# Patient Record
Sex: Male | Born: 1990 | Race: White | Hispanic: No | Marital: Single | Smoking: Never smoker
Health system: Southern US, Community
[De-identification: ages and names within clinical notes are randomized; demographics above are authoritative.]

---

## 2014-11-13 ENCOUNTER — Other Ambulatory Visit: Payer: Self-pay | Admitting: Neurology

## 2014-11-13 DIAGNOSIS — R1310 Dysphagia, unspecified: Secondary | ICD-10-CM

## 2014-11-22 ENCOUNTER — Ambulatory Visit
Admission: RE | Admit: 2014-11-22 | Discharge: 2014-11-22 | Disposition: A | Discharge status: Home / Self Care | Payer: Medicaid Other | Source: Ambulatory Visit | Attending: Neurology | Admitting: Neurology

## 2014-11-22 DIAGNOSIS — R1310 Dysphagia, unspecified: Secondary | ICD-10-CM | POA: Diagnosis not present

## 2014-11-22 NOTE — Therapy (Addendum)
[facility] DIAGNOSTIC RADIOLOGY [address] [place], [place], [address] Phone: [phone]   Fax:     Modified Barium Swallow  Patient Details  Name: Jeremiah Silva MRN: [id number] Date of Birth: January 07, 1991 Referring Provider:  Morene Crocker, MD  Encounter Date: 11/22/2014   Subjective: Patient behavior: (alertness, ability to follow instructions, etc.): pt is Deaf requiring a Sign Language Interpreter at this evaluation. He is dx'd w/ CP, developmental learning disability, and decreased attention w/ tasks. Father reported pt has been experiencing chest congestion (no dx of Pneumonia), runny nose, and "wet breathing" after meals. Father stated he suspected "some of this could be due to his swallowing". He indicated pt is impulsive when eating putting large boluses of food in his mouth at a time and eating quickly. Pt does not follow instruction consistently and requires repetition and cues. Pt was not able to perform a volitional cough w/ cues/instruction from Interpreter/Father and model; Father stated pt could not blow his nose either on command. Pt drinks from a straw at home d/t moderate+ anterior spillage. Pt w/ native dentition and able to assist feeding himself though w/ decreased coordination.  Chief complaint: Dysphagia   Objective:  Radiological Procedure: A videoflouroscopic evaluation of oral-preparatory, reflex initiation, and pharyngeal phases of the swallow was performed; as well as a screening of the upper esophageal phase.  I. POSTURE: upright II. VIEW: lateral III. COMPENSATORY STRATEGIES: attempted to use multiple, dry swallows, however, pt was unable to follow instructions; time was given b/t trials; alternated food/liquid boluses  IV. BOLUSES ADMINISTERED:  Thin Liquid: multiple sips via straw  Nectar-thick Liquid: multiple sips via straw  Honey-thick Liquid: NT  Puree: 3 trials  Mechanical Soft: 1 trials V. RESULTS OF  EVALUATION: A. ORAL PREPARATORY PHASE: (The lips, tongue, and velum are observed for strength and coordination)       **Overall Severity Rating: MODERATE+. Lingual discoordination for fluid and complete A-P transfer of boluses; weakness; diffuse oral residue b/t trials. Premature spillage w/ liquids. Pt exhibited poor coordination and strength of complete labial closure around the straw and discoordination of suck and swallow.    B. SWALLOW INITIATION/REFLEX: (The reflex is normal if "triggered" by the time the bolus reached the base of the tongue)  **Overall Severity Rating: MODERATE. Delayed pharyngeal swallow initiation noted w/ all trial consistencies - boluses triggered b/t the valleculae and at times spilling to the pyriform sinuses.   C. PHARYNGEAL PHASE: (Pharyngeal function is normal if the bolus shows rapid, smooth, and continuous transit through the pharynx and there is no pharyngeal residue after the swallow)  **Overall Severity Rating: MODERATE-SEVERE. Bolus residue remained throughout the pharynx, moreso in the pyriform sinuses. Without an effortful swallow, residue did not easily pass through the UES - suspect decreased coordination w/ timing of UES relaxation for bolus to move into the Cervical Esophagus. Noted possible soft tissue in the upper Cervical Esophagus just below the UES upon viewing the swallow in slow motion. Pt was not able to use strategies of multiple swallow but time was given for pt to nonvolitionally swallow b/t trials to aid clearing of this residue as well as alternating food/liquid boluses; bolus residue never completely reduced/cleared.    D. LARYNGEAL PENETRATION: (Material entering into the laryngeal inlet/vestibule but not aspirated): b/t trials of food/liquid, trace-min. bolus residue was noted in the laryngeal vestibule from the pharyngeal residue; laryngeal penetration of thin liquids was noted x1 episode during multiple sipping trials. This was SILENT laryngeal  penetration. Pt was unable to follow through w/ a throat clear/cough strategy in attempt to protect his airway.   E. ASPIRATION: b/t trials of food/liquid, trace-min. bolus residue was noted in the upper trachea then disappeared below the shadow of the shoulders in lateral view. This was SILENT aspiration. Pt was unable to follow through w/ a cough strategy in attempts to protect his airway.  F. ESOPHAGEAL PHASE: (Screening of the upper esophagus): inconsistent, trace-min. Retention of bolus residue in the upper Esophagus below the UES which appeared to clear w/ time.   ASSESSMENT: Pt presents w/ Moderate+ oropharyngeal phase dysphagia w/ Silent laryngeal penetration and aspiration occuring. Bolus residue remained throughout the pharynx, moreso in the pyriform sinuses. Residue did not easily pass through the UES - suspect decreased coordination w/ timing of UES relaxation for bolus material to move into the Cervical Esophagus. Noted possible soft tissue in the upper Cervical Esophagus just below the UES upon viewing the swallow in slow motion as well. Pt was not able to use strategies of multiple swallow but time was given for pt to nonvolitionally swallow b/t trials to aid clearing of this residue as well as alternating food/liquid boluses; bolus residue never completely reduced/cleared. When laryngeal penetration and aspiration occurred, pt was unable to follow instruction to use a cough/throat clear strategy to aid airway protection and clearing of upper airway(Father attempted to assist, but pt was unable to follow directions). Oral phase of swallowing was c/b decreased coordination and strength; decreased labial closure.  Pt is at increased risk for aspiration of the pharyngeal residue collecting in the pharynx. Rec. f/u w/ ENT, possibly GI, to assess UES function and upper Esophageal motility. Rec. Monitoring of pt's eating behaviors to reduce risk for aspiration sec. to pt's impulsivity when  eating/drinking.   PLAN/RECOMMENDATIONS:  A. Diet: mech soft, thin liquids  B. Swallowing Precautions: aspiration precautions - monitor for impulsivity during eating/drinking and limit as best able to  C. Recommended consultation to: ENT consult; GI consult  D. Therapy recommendations: discussed use of aspiration precautions during meals.   E. Results and recommendations were discussed w/ Father      End of Session - 25-Nov-2014 1553    Visit Number 1   Number of Visits 1   Date for SLP Re-Evaluation November 25, 2014   SLP Start Time 1300   SLP Stop Time  1400   SLP Time Calculation (min) 60 min   Activity Tolerance Patient tolerated treatment well      No past medical history on file.  No past surgical history on file.  There were no vitals filed for this visit.  Visit Diagnosis: Dysphagia - Plan: DG SWALLOWING FUNC-SPEECH PATHOLOGY, DG SWALLOWING FUNC-SPEECH PATHOLOGY                               G-Codes - 11-25-14 1554    Functional Assessment Tool Used clinical judgement; MBSS   Functional Limitations Swallowing   Swallow Current Status (Z6109) At least 60 percent but less than 80 percent impaired, limited or restricted   Swallow Goal Status (U0454) At least 60 percent but less than 80 percent impaired, limited or restricted   Swallow Discharge Status [phone]) At least 60 percent but less than 80 percent impaired, limited or restricted          Problem List There are no active problems to display for this patient.  Jerilynn Som, MS, CCC-SLP  Veena Sturgess 11-25-14, 4:00  PM  [facility] DIAGNOSTIC RADIOLOGY [address] [place], [place], [address] Phone: [phone]   Fax:

## 2014-11-29 ENCOUNTER — Encounter: Payer: Self-pay | Admitting: Occupational Therapy

## 2014-11-29 ENCOUNTER — Ambulatory Visit: Payer: Medicaid Other | Attending: Neurology | Admitting: Occupational Therapy

## 2014-11-29 DIAGNOSIS — R279 Unspecified lack of coordination: Secondary | ICD-10-CM | POA: Insufficient documentation

## 2014-11-29 NOTE — Therapy (Signed)
[place] [facility] MAIN [facility] SERVICES [address] [place], [place], [address] Phone: [phone]   Fax:  [phone]  Occupational Therapy Evaluation  Patient Details  Name: Jeremiah Silva MRN: [id number] Date of Birth: Jan 05, 1991 Referring Provider:  Morene Crocker, MD  Encounter Date: 11/29/2014      OT End of Session - 11/29/14 1419    Visit Number 1   Number of Visits 1   Date for OT Re-Evaluation --  NA   OT Start Time 1301   OT Stop Time 1358   OT Time Calculation (min) 57 min   Activity Tolerance Patient tolerated treatment well   Behavior During Therapy [facility] for tasks assessed/performed      History reviewed. No pertinent past medical history.  History reviewed. No pertinent past surgical history.  There were no vitals filed for this visit.  Visit Diagnosis:  Lack of coordination - Plan: Ot plan of care cert/re-cert      Subjective Assessment - 11/29/14 1415    Subjective  Largest complaint is mobility of right hand.   Pertinent History 24 year old with CP with decreasing function of right hand.    24 year old male with CP who came to [facility] with decreasing right hand function interfering with some activities of daily living like dressing and bathing (such as not being able to reach left arm pit with right hand, opening bottles and jars, dispensing soap and shampoo. Issued dycem and demonstrated how to use to open jars and bottles, showed buttoner, and showed long sponge. Patient is able to reach left arm pit with long sponge. Gave information on how to obtain assistive devices and research more.                         OT Education - 11/29/14 1416    Education provided Yes   Education Details Educated patient and father regarding adapted equipment for dressing and bathing and opening jars and bottles. Issued dycem to help with the jars and bottles. Gave patient's father medical supply  web sight and phone number to [employer] for further adapted devices.    Person(s) Educated Patient;Parent(s)   Methods Explanation;Demonstration   Comprehension Verbalized understanding             OT Long Term Goals - 11/29/14 1422    OT LONG TERM GOAL #1   Title Patient and dad will understand use of adapted devices to aid in BADL.   Time 1   Period Days   Status New               Plan - 11/29/14 1420    OT Frequency 1x / week   OT Duration --  1 week   OT Treatment/Interventions Self-care/ADL training   Plan OT education in adapted devices   Consulted and Agree with Plan of Care Patient;Family member/caregiver        Problem List There are no active problems to display for this patient.   Gwyndolyn Kaufman, MS/OTR/L  11/29/2014, 2:31 PM  [facility] [facility] MAIN [facility] SERVICES [address] [place], [place], [address] Phone: [phone]   Fax:  [phone]

## 2017-07-12 IMAGING — RF DG SWALLOWING FUNCTION - [facility]PT [facility]
13 of 18 series · 13 of 24 positions shown · non-contrast
Comparison: none

[Series 1: run · 1 of 20 frames shown (1 of 13)]
[frame 4/20]
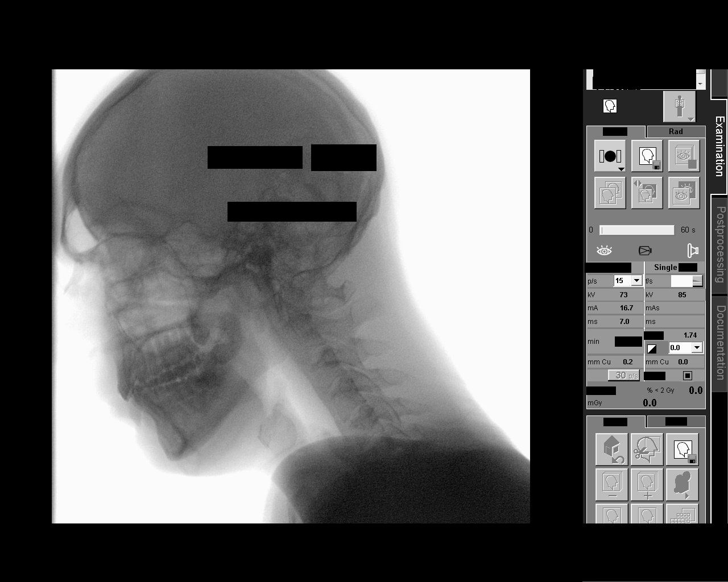

[Series 2: run · 1 of 27 frames shown (2 of 13)]
[frame 23/27]
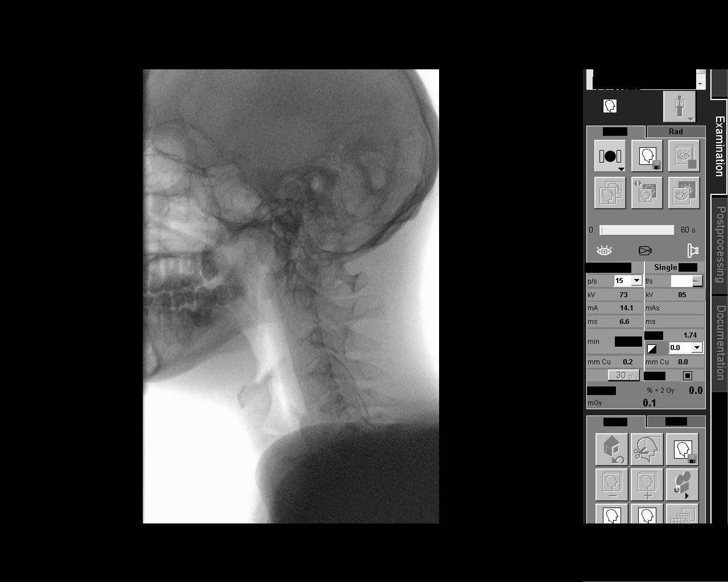

[Series 4: run · 1 of 575 frames shown (3 of 13)]
[frame 87/575]
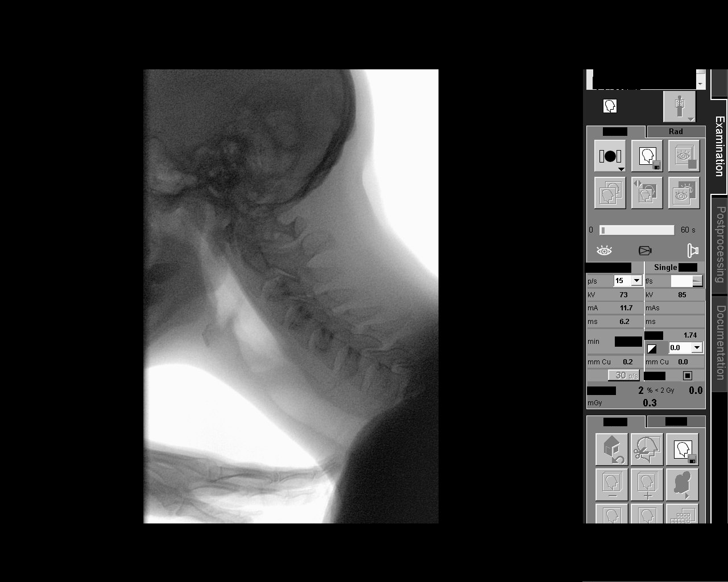

[Series 5: run · 1 of 324 frames shown (4 of 13)]
[frame 321/324]
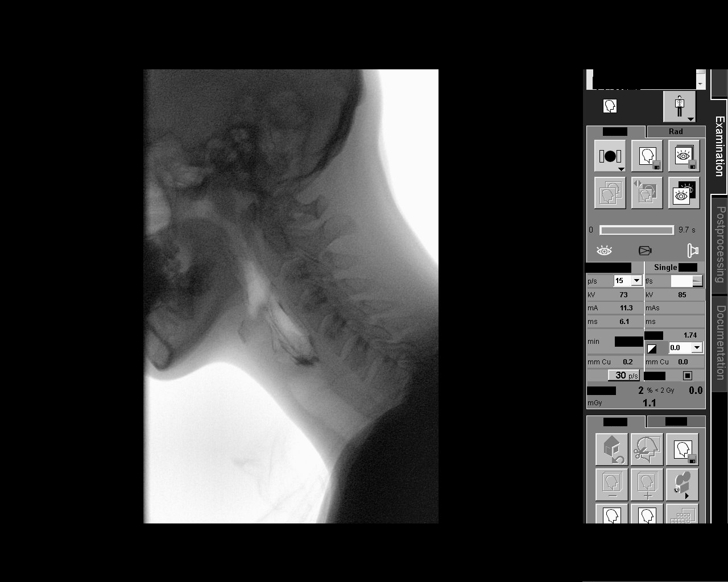

[Series 8: run · 1 of 886 frames shown (5 of 13)]
[frame 275/886]
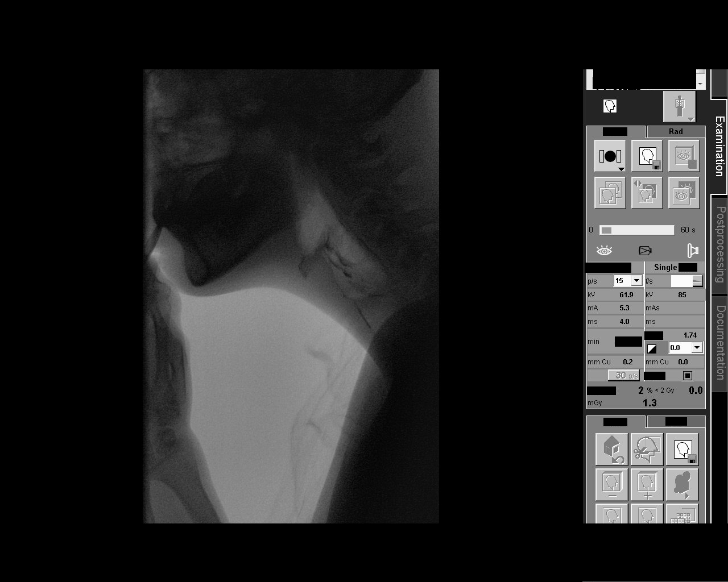

[Series 9: run · 1 of 160 frames shown (6 of 13)]
[frame 137/160]
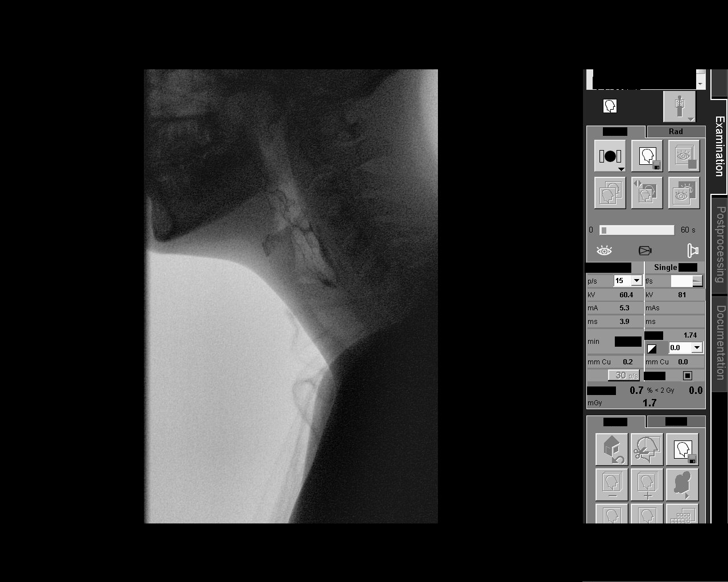

[Series 11: run · 1 of 96 frames shown (7 of 13)]
[frame 49/96]
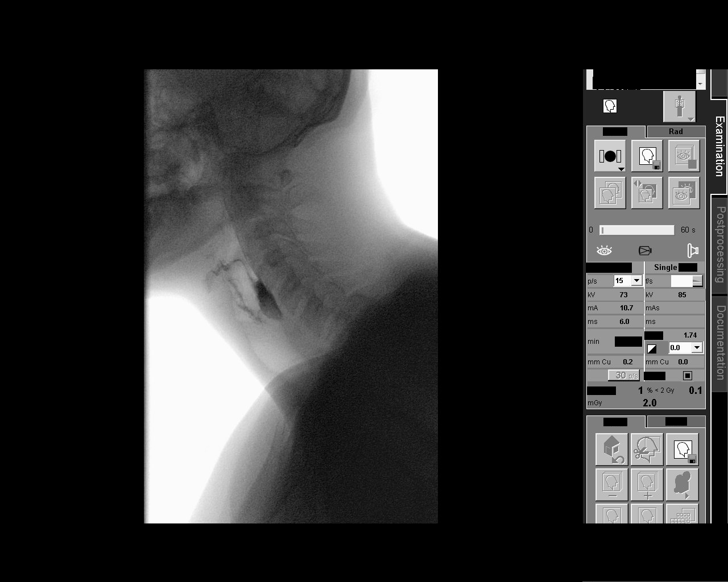

[Series 12: run · 1 of 130 frames shown (8 of 13)]
[frame 20/130]
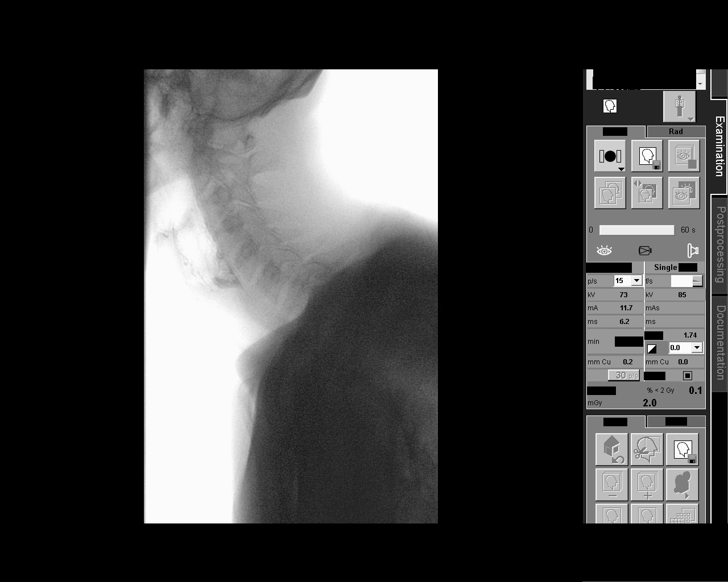

[Series 13: run · 1 of 601 frames shown (9 of 13)]
[frame 301/601]
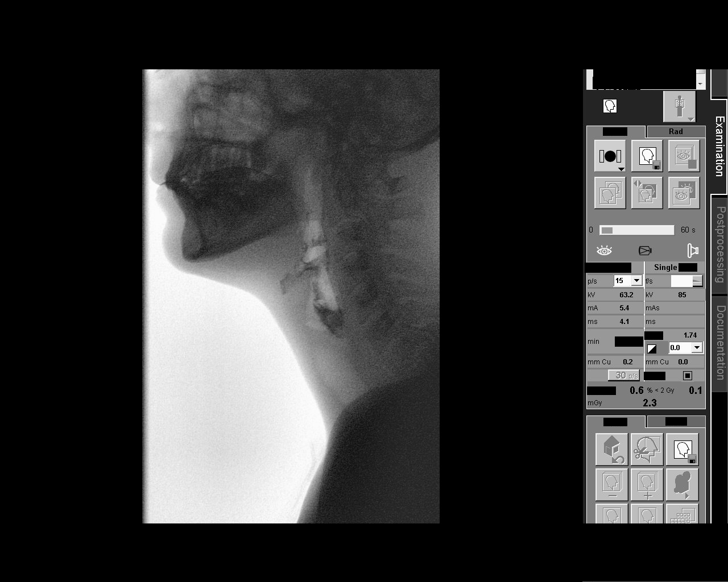

[Series 15: run · 1 of 85 frames shown (10 of 13)]
[frame 13/85]
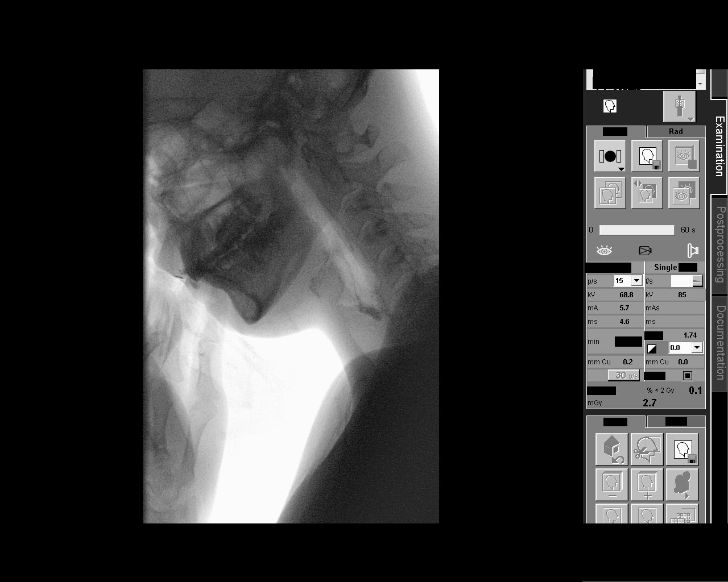

[Series 16: run · 1 of 95 frames shown (11 of 13)]
[frame 81/95]
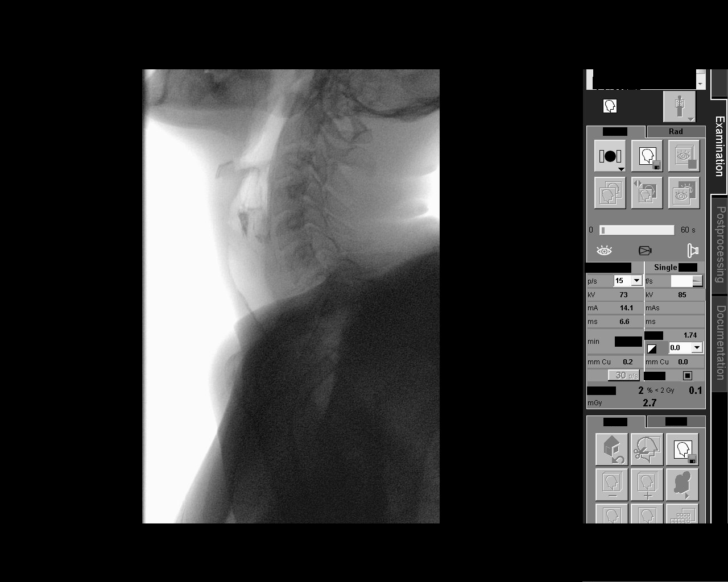

[Series 18: run · 1 of 170 frames shown (12 of 13)]
[frame 85/170]
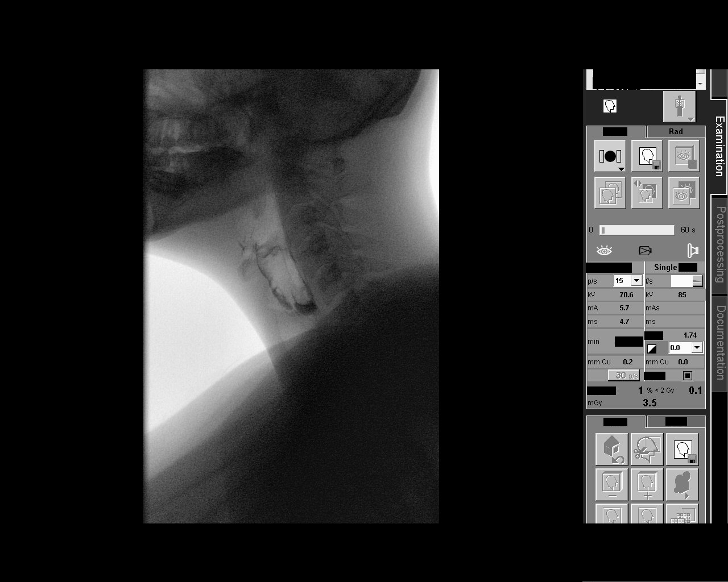

[Series 19: run · 1 of 859 frames shown (13 of 13)]
[frame 731/859]
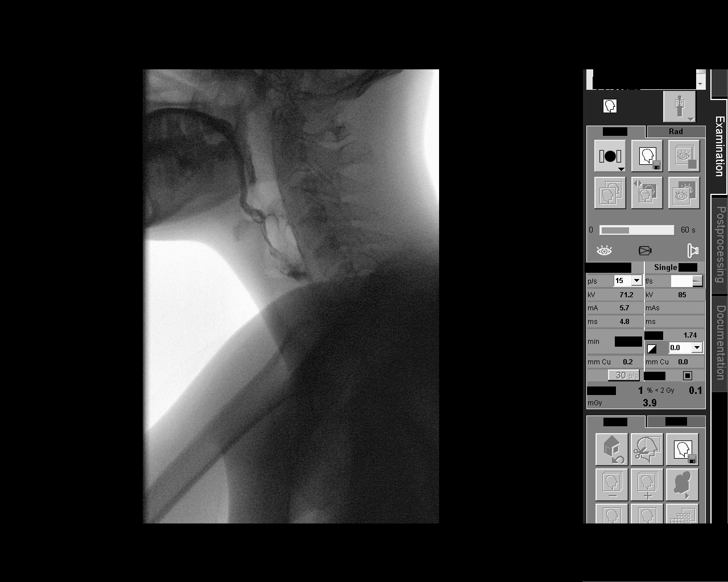

[13 of 24 positions shown; findings below may reference images not displayed]

FLUOROSCOPY FOR SWALLOWING FUNCTION STUDY:
Fluoroscopy was provided for swallowing function study, which was administered by a speech pathologist.  Final results and recommendations from this study are contained within the speech pathology report.

## 2017-09-13 ENCOUNTER — Other Ambulatory Visit: Payer: Self-pay | Admitting: Internal Medicine

## 2017-09-13 DIAGNOSIS — R1312 Dysphagia, oropharyngeal phase: Secondary | ICD-10-CM

## 2017-10-11 ENCOUNTER — Ambulatory Visit: Admission: RE | Admit: 2017-10-11 | Payer: Medicaid Other | Source: Ambulatory Visit

## 2018-03-18 ENCOUNTER — Other Ambulatory Visit: Payer: Self-pay | Admitting: Family Medicine

## 2018-03-18 DIAGNOSIS — R748 Abnormal levels of other serum enzymes: Secondary | ICD-10-CM

## 2018-03-24 ENCOUNTER — Ambulatory Visit
Admission: RE | Admit: 2018-03-24 | Discharge: 2018-03-24 | Disposition: A | Discharge status: Home / Self Care | Payer: Medicaid Other | Source: Ambulatory Visit | Attending: Family Medicine | Admitting: Family Medicine

## 2018-03-24 DIAGNOSIS — R1312 Dysphagia, oropharyngeal phase: Secondary | ICD-10-CM | POA: Diagnosis present

## 2018-03-24 DIAGNOSIS — R748 Abnormal levels of other serum enzymes: Secondary | ICD-10-CM | POA: Diagnosis present

## 2018-04-28 ENCOUNTER — Ambulatory Visit: Payer: Medicaid Other | Attending: Family Medicine | Admitting: Speech Pathology

## 2018-04-28 DIAGNOSIS — R1312 Dysphagia, oropharyngeal phase: Secondary | ICD-10-CM

## 2018-04-29 ENCOUNTER — Encounter: Payer: Self-pay | Admitting: Speech Pathology

## 2018-04-29 ENCOUNTER — Other Ambulatory Visit: Payer: Self-pay

## 2018-04-29 NOTE — Therapy (Signed)
[facility] [address] [place], [place], [address] Phone: [phone]   Fax:  [phone]  Speech Language Pathology Evaluation  Patient Details  Name: Jeremiah Silva MRN: [id number] Date of Birth: 09-Sep-1990 Referring Provider (SLP): Tanna FurryZHOU-TALBERT, SERENA S   Encounter Date: 04/28/2018  End of Session - 04/29/18 1620    Visit Number  1    Number of Visits  1    Date for SLP Re-Evaluation  04/28/18       History reviewed. No pertinent past medical history.  History reviewed. No pertinent surgical history.  There were no vitals filed for this visit.      SLP Evaluation [facility] - 04/29/18 0001      SLP Visit Information   SLP Received On  04/28/18    Referring Provider (SLP)  Sullivan LoneZHOU-TALBERT, SERENA S    Onset Date  03/16/2018    Medical Diagnosis  Cerebral palsy      Subjective   Subjective  Patient is non-verbal and has intellectual disability    Patient/Family Stated Goal  Safe swallowing      General Information   HPI  pt is Deaf requiring a Sign Language Interpreter at this evaluation. He is dx'd w/ CP, developmental learning disability, and decreased attention w/ tasks. Father reported pt has been experiencing chest congestion (no dx of Pneumonia), runny nose, and "wet breathing" after meals. Father stated he suspected "some of this could be due to his swallowing". He indicated pt is impulsive when eating putting large boluses of food in his mouth at a time and eating quickly. Pt does not follow instruction consistently and requires repetition and cues. Pt was not able to perform a volitional cough w/ cues/instruction from Interpreter/Father and model; Father stated pt could not blow his nose either on command. Pt drinks from a straw at home d/t moderate+ anterior spillage. Pt w/ native dentition and able to assist feeding himself though w/ decreased coordination.       Prior Functional Status   Cognitive/Linguistic  Baseline  Baseline deficits    Baseline deficit details  developmental learning disability     Lives With  Family   Father     Cognition   Overall Cognitive Status  History of cognitive impairments - at baseline      Oral Motor/Sensory Function   Overall Oral Motor/Sensory Function  Impaired at baseline    Labial ROM  Reduced left    Labial Strength  Reduced    Labial Sensation  Reduced    Labial Coordination  Reduced    Lingual ROM  Reduced right;Reduced left    Lingual Strength  Reduced    Lingual Sensation  Reduced    Lingual Coordination  Reduced      Motor Speech   Overall Motor Speech  Impaired at baseline   Deaf                     SLP Education - 04/29/18 1619    Education Details  aspiration precautions, diet modifications, stay active    Person(s) Educated  Parent(s)    Methods  Explanation;Handout    Comprehension  Verbalized understanding           Plan - 04/29/18 1620    Clinical Impression Statement  Jeremiah CrazierDylan Silva is a 28 year old with cerebral palsy, deafness, developmental learning disability, and poor attention to task.  The patient had an MBS 11/22/2014 showing silent laryngeal penetration and aspiration, pharyngeal  residue post swallow, reduced lingual coordination and strength, and reduced lip closure.  Overall the patient has maintained his pulmonary health over the past 3  years despite probable ongoing aspiration.  The patient's father is very knowledgeable and realistic regarding his son's swallowing status.  During this session we reviewed the potential dangers of aspiration and what to look for regarding pneumonia.  We reviewed general aspiration precautions with emphasis on oral care, staying active to help keep lungs clear, and strategies/modifications to prevent choking on foods when the patient is not supervised.  Direct speech therapy is not likely to improve swallow function as the patient does not have the cognitive skills to learn  / implement safe swallowing techniques independently or perform swallowing exercises accurately. However, the patient would benefit from referral to physical therapy to develop an appropriate home exercise program to encourage more physical activity.  The patient's father reports that they are working with [employer] to get supported employment.  I recommended that if this does not work out as planned, he might consider an adult day care program so that the patient can have the supervision he needs for safer eating.     Speech Therapy Frequency  One time visit    Treatment/Interventions  Aspiration precaution training;Patient/family education    Consulted and Agree with Plan of Care  Family member/caregiver    Family Member Consulted  Father       Patient will benefit from skilled therapeutic intervention in order to improve the following deficits and impairments:   Dysphagia, oropharyngeal phase - Plan: SLP plan of care cert/re-cert    Problem List There are no active problems to display for this patient.  Dollene PrimroseSusan G Asusena Sigley, MS/CCC- SLP  Leandrew KoyanagiAbernathy, Susie 04/29/2018, 4:26 PM  [facility] [address] [place], [place], [address] Phone: [phone]   Fax:  [phone]  Name: Jeremiah CrazierDylan Silva MRN: [id number] Date of Birth: 07-28-1990

## 2018-05-02 ENCOUNTER — Ambulatory Visit: Payer: Medicaid Other | Admitting: Speech Pathology

## 2018-05-04 ENCOUNTER — Ambulatory Visit: Payer: Medicaid Other | Admitting: Speech Pathology

## 2018-05-09 ENCOUNTER — Ambulatory Visit: Payer: Medicaid Other | Admitting: Speech Pathology

## 2018-05-11 ENCOUNTER — Ambulatory Visit: Payer: Medicaid Other | Admitting: Speech Pathology

## 2018-05-16 ENCOUNTER — Ambulatory Visit: Payer: Medicaid Other | Admitting: Speech Pathology

## 2018-05-18 ENCOUNTER — Ambulatory Visit: Payer: Medicaid Other | Admitting: Speech Pathology

## 2018-05-19 ENCOUNTER — Ambulatory Visit: Payer: Medicaid Other | Admitting: Speech Pathology

## 2018-05-23 ENCOUNTER — Ambulatory Visit: Payer: Medicaid Other | Admitting: Speech Pathology

## 2018-05-25 ENCOUNTER — Ambulatory Visit: Payer: Medicaid Other | Admitting: Speech Pathology

## 2018-05-30 ENCOUNTER — Ambulatory Visit: Payer: Medicaid Other | Admitting: Speech Pathology

## 2018-06-01 ENCOUNTER — Ambulatory Visit: Payer: Medicaid Other | Admitting: Speech Pathology

## 2018-06-06 ENCOUNTER — Ambulatory Visit: Payer: Medicaid Other | Admitting: Speech Pathology

## 2018-06-08 ENCOUNTER — Ambulatory Visit: Payer: Medicaid Other | Admitting: Speech Pathology

## 2018-06-13 ENCOUNTER — Ambulatory Visit: Payer: Medicaid Other | Admitting: Speech Pathology

## 2020-11-11 IMAGING — US US ABDOMEN LIMITED
2 series · 14 of 25 positions shown · non-contrast
Comparison: None.

CLINICAL DATA: 27-year-old male with abnormal alkaline phosphatase
level.

EXAM:
ULTRASOUND ABDOMEN LIMITED RIGHT UPPER QUADRANT

[Series 1: us abdomen limited · 12 of 37 slices shown (1 of 2)]
[im 1/37]
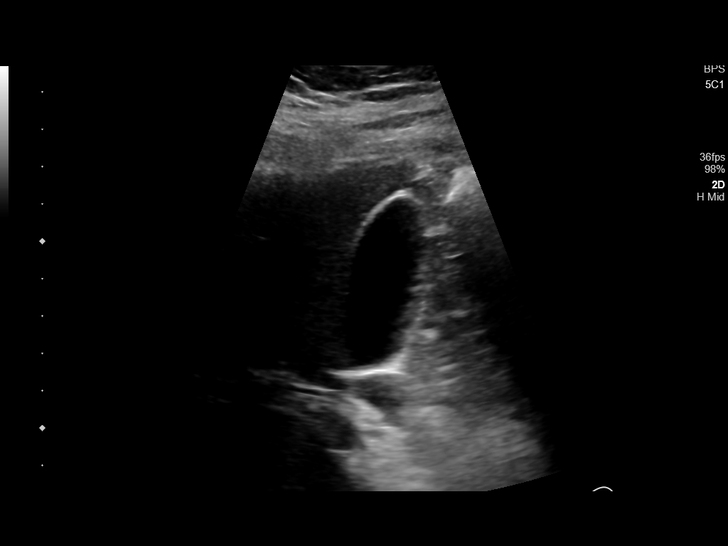
[im 4/37]
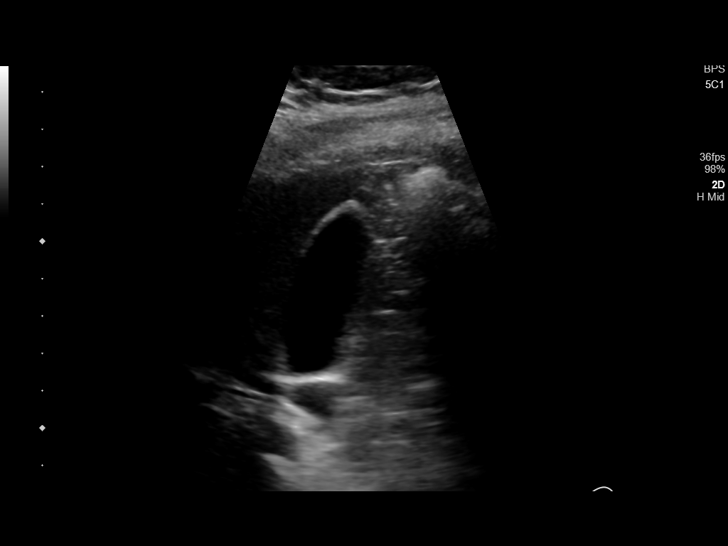
[im 7/37]
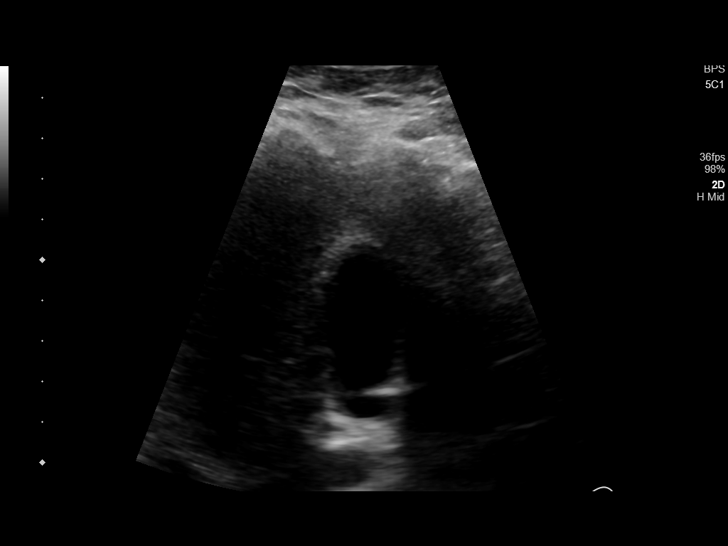
[im 11/37]
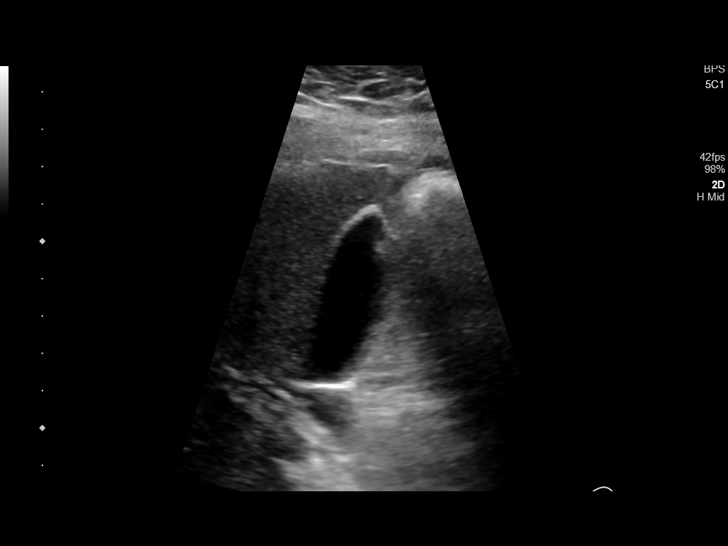
[im 14/37]
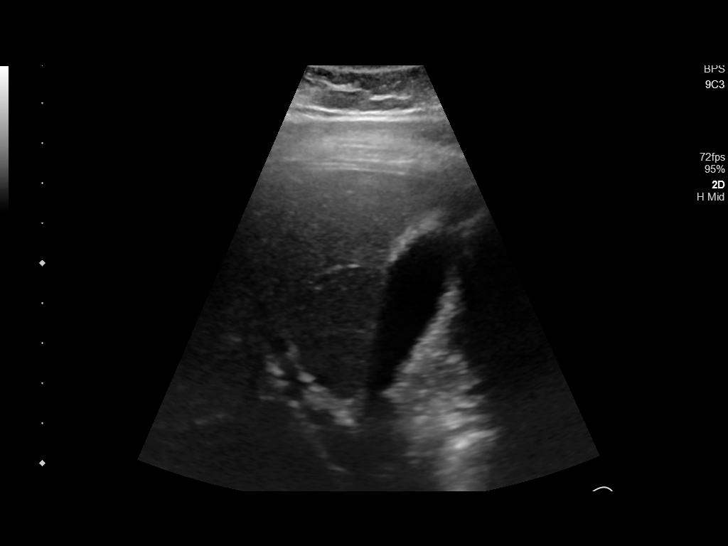
[im 16/37]
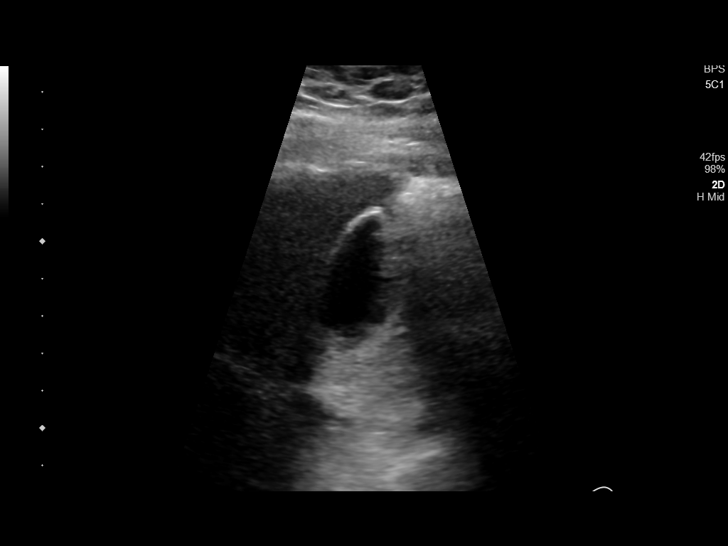
[im 19/37]
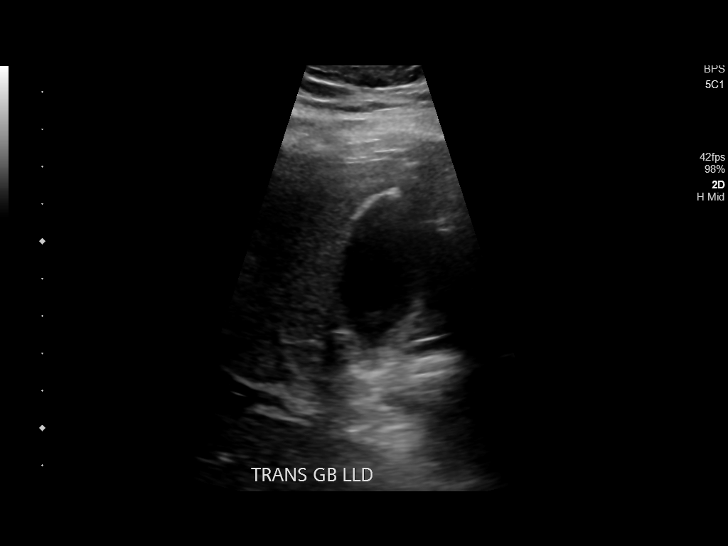
[im 23/37]
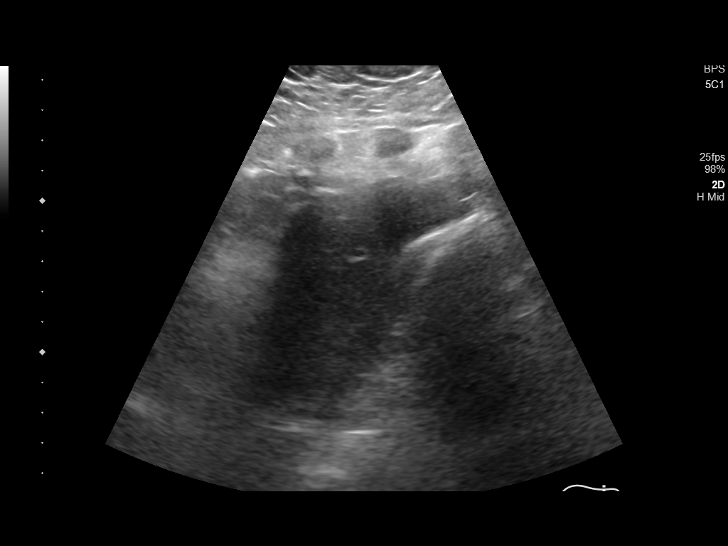
[im 26/37]
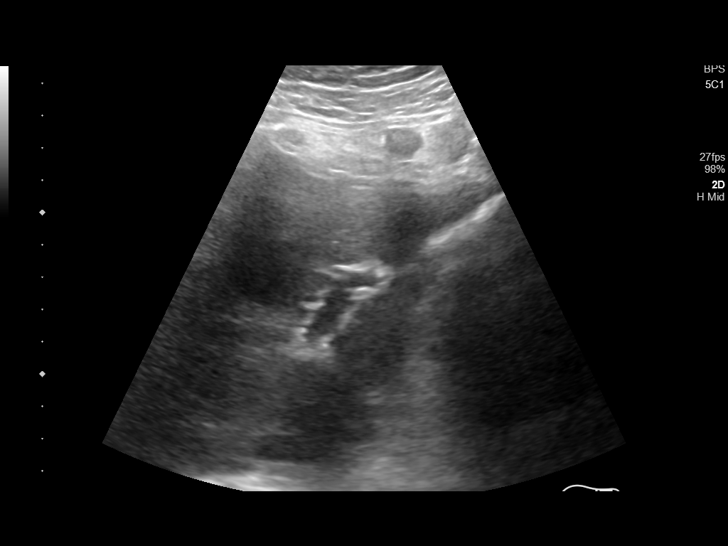
[im 28/37]
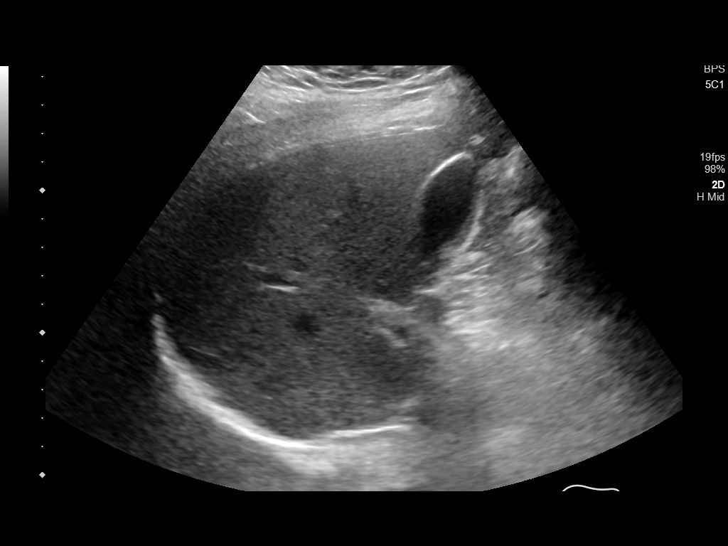
[im 31/37]
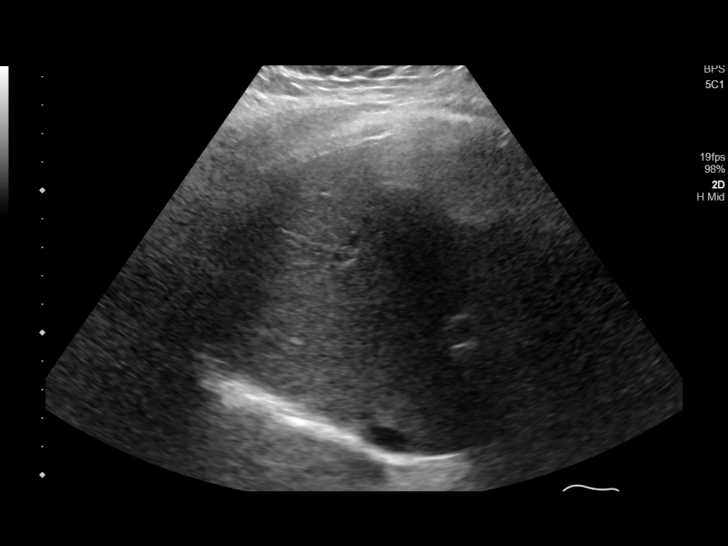
[im 35/37]
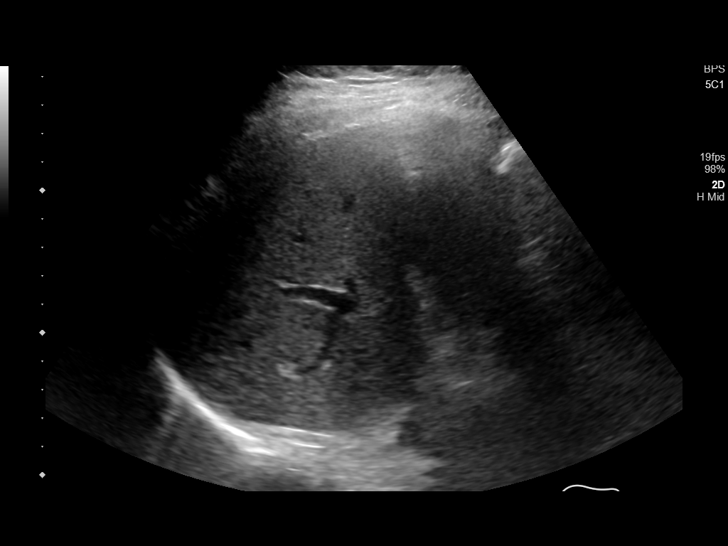

[Series 1001: us abdomen limited · 2 of 5 slices shown (2 of 2)]
[im 1/5]
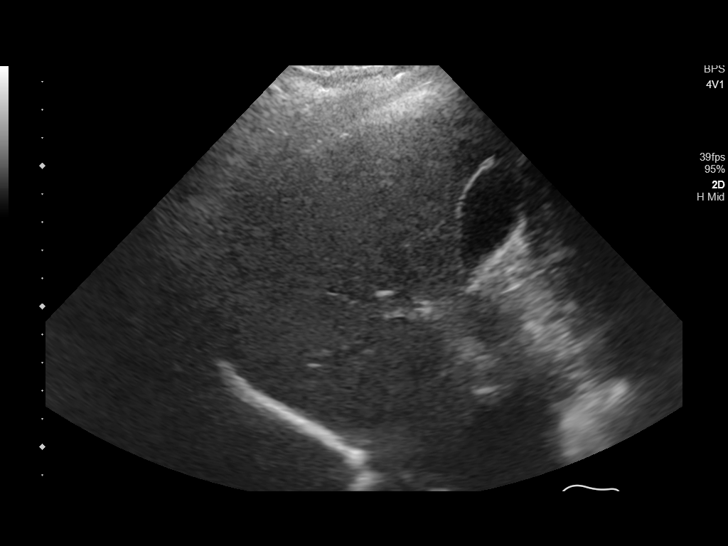
[im 5/5]
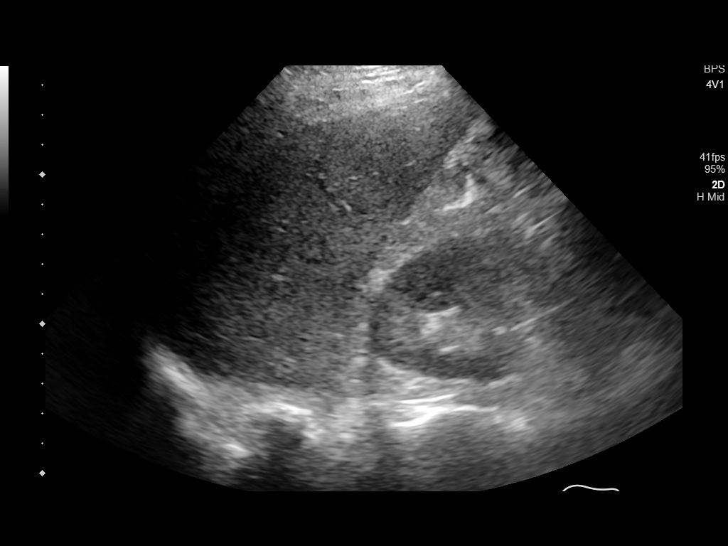

[14 of 25 positions shown; findings below may reference images not displayed]

FINDINGS: Gallbladder:

No gallstones or wall thickening visualized. No sonographic Murphy
sign noted by sonographer.

Common bile duct:

Diameter: 2 millimeters, normal.

Liver:

No focal lesion identified. Within normal limits in parenchymal
echogenicity. Portal vein is patent on color Doppler imaging with
normal direction of blood flow towards the liver.
IMPRESSION: Normal right upper quadrant ultrasound.
